# Patient Record
Sex: Female | Born: 1960
Health system: Southern US, Community
[De-identification: ages and names within clinical notes are randomized; demographics above are authoritative.]

## PROBLEM LIST (undated history)

## (undated) DIAGNOSIS — R011 Cardiac murmur, unspecified: Secondary | ICD-10-CM

## (undated) DIAGNOSIS — E079 Disorder of thyroid, unspecified: Secondary | ICD-10-CM

## (undated) HISTORY — DX: Disorder of thyroid, unspecified: E07.9

## (undated) HISTORY — DX: Cardiac murmur, unspecified: R01.1

---

## 1998-08-23 ENCOUNTER — Encounter: Payer: Self-pay | Admitting: Gastroenterology

## 1998-08-23 ENCOUNTER — Ambulatory Visit (HOSPITAL_COMMUNITY): Admission: RE | Admit: 1998-08-23 | Discharge: 1998-08-23 | Payer: Self-pay | Admitting: Gastroenterology

## 1999-08-07 ENCOUNTER — Encounter: Admission: RE | Admit: 1999-08-07 | Discharge: 1999-08-07 | Payer: Self-pay | Admitting: Gastroenterology

## 1999-08-07 ENCOUNTER — Encounter: Payer: Self-pay | Admitting: Gastroenterology

## 1999-11-03 ENCOUNTER — Other Ambulatory Visit: Admission: RE | Admit: 1999-11-03 | Discharge: 1999-11-03 | Payer: Self-pay | Admitting: *Deleted

## 2000-09-13 ENCOUNTER — Other Ambulatory Visit: Admission: RE | Admit: 2000-09-13 | Discharge: 2000-09-13 | Payer: Self-pay | Admitting: *Deleted

## 2001-09-18 ENCOUNTER — Encounter: Payer: Self-pay | Admitting: Gastroenterology

## 2001-09-18 ENCOUNTER — Encounter: Admission: RE | Admit: 2001-09-18 | Discharge: 2001-09-18 | Payer: Self-pay | Admitting: Gastroenterology

## 2001-09-19 ENCOUNTER — Other Ambulatory Visit: Admission: RE | Admit: 2001-09-19 | Discharge: 2001-09-19 | Payer: Self-pay | Admitting: Obstetrics and Gynecology

## 2003-04-22 ENCOUNTER — Other Ambulatory Visit: Admission: RE | Admit: 2003-04-22 | Discharge: 2003-04-22 | Payer: Self-pay | Admitting: Obstetrics and Gynecology

## 2003-05-07 ENCOUNTER — Ambulatory Visit (HOSPITAL_COMMUNITY): Admission: RE | Admit: 2003-05-07 | Discharge: 2003-05-07 | Payer: Self-pay | Admitting: Gastroenterology

## 2005-08-07 ENCOUNTER — Encounter: Admission: RE | Admit: 2005-08-07 | Discharge: 2005-08-07 | Payer: Self-pay | Admitting: Gastroenterology

## 2006-10-20 IMAGING — CT CT ABDOMEN W/ CM
1 of 7 series · 13 of 32 positions shown, 18 images · IV contrast (READICAT/WATER & [ID] OMNI 300)
Comparison: CT abdomen and pelvis, 05/07/03.

CLINICAL DATA: Epigastric pain.
 ABDOMEN CT WITH CONTRAST:
TECHNIQUE: Multidetector CT imaging of the abdomen was performed following the standard protocol during bolus administration of intravenous contrast.
 Contrast:   100 cc Omnipaque 300.  Oral contrast was also used.
TECHNIQUE: Multidetector CT imaging of the pelvis was performed following the standard protocol during bolus administration of intravenous contrast.

[Series 102: routine abdomen · axial · 0.70mm/px · z∈[-388,-29]mm · 13 of 333 slices shown, 18 images]
[im 23/333  soft-tissue]
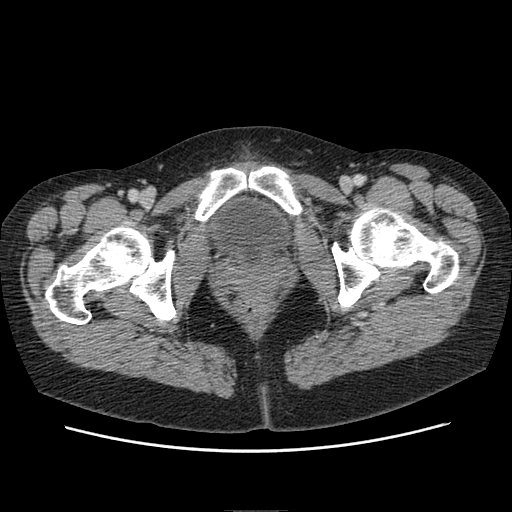
[im 23/333  bone]
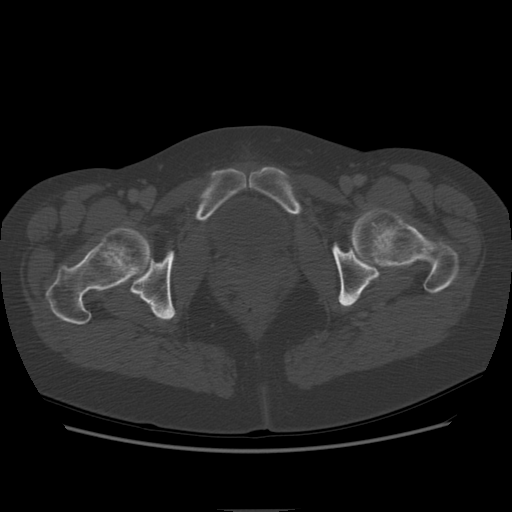
[im 45/333  soft-tissue]
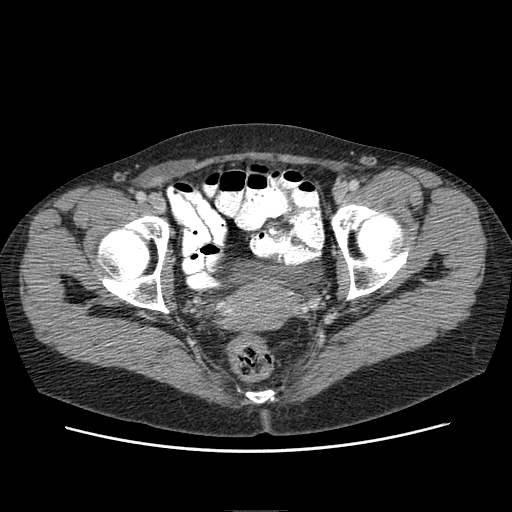
[im 67/333  soft-tissue]
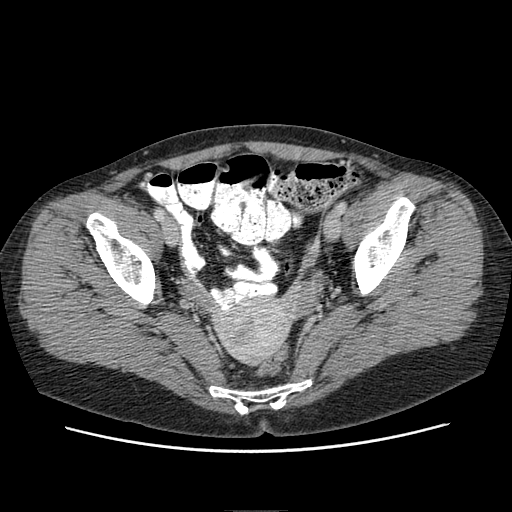
[im 111/333  soft-tissue]
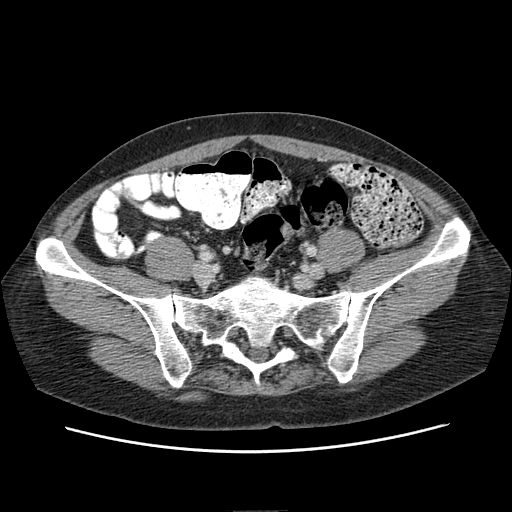
[im 133/333  soft-tissue]
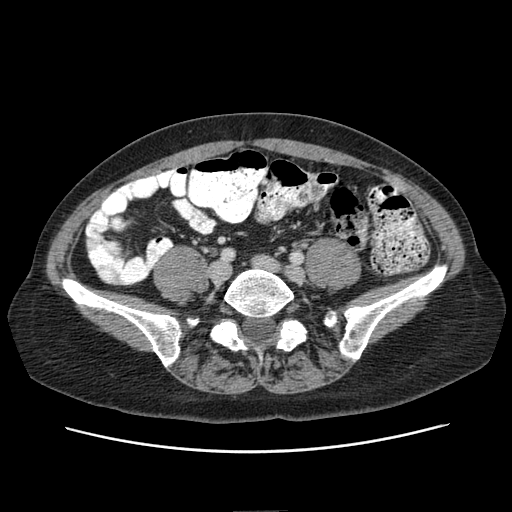
[im 155/333  soft-tissue]
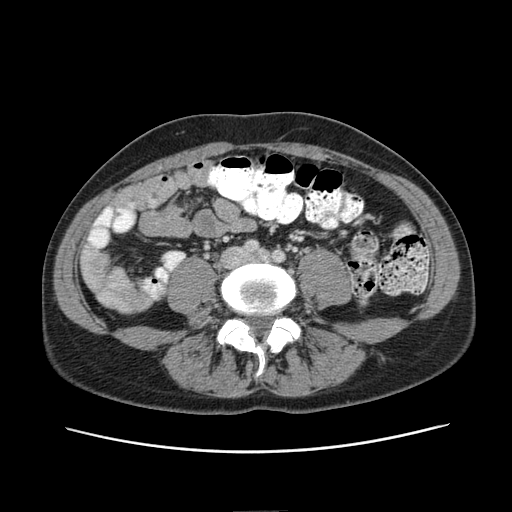
[im 178/333  soft-tissue]
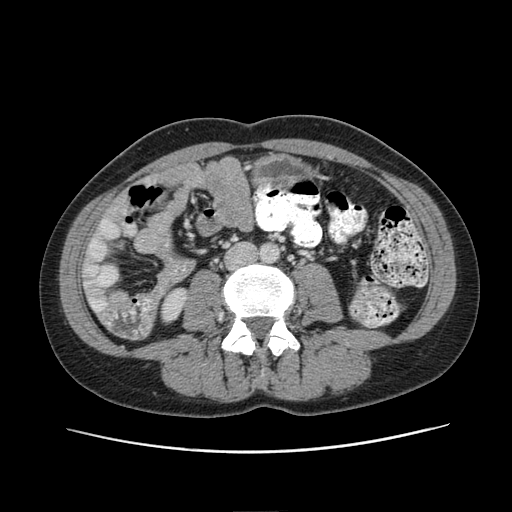
[im 200/333  soft-tissue]
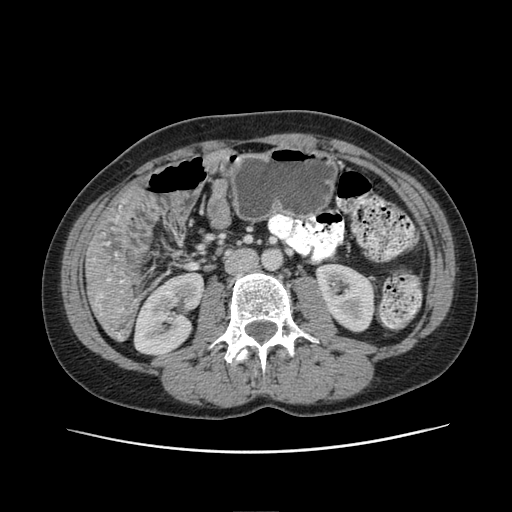
[im 222/333  soft-tissue]
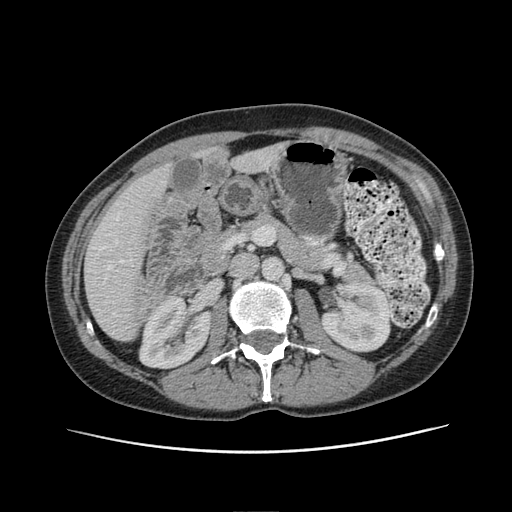
[im 222/333  bone]
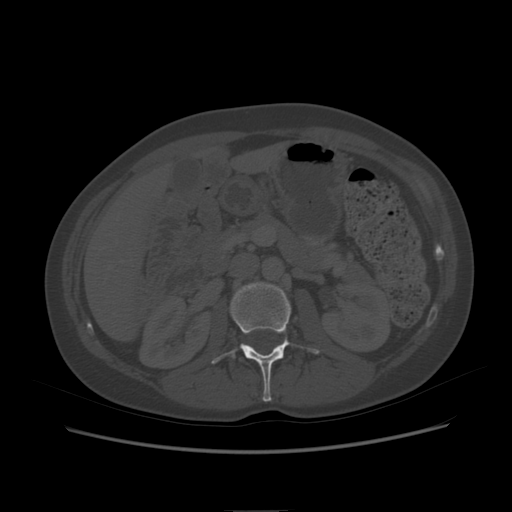
[im 244/333  lung]
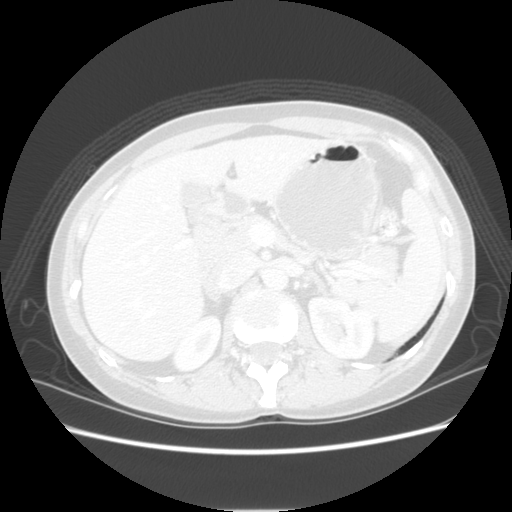
[im 266/333  soft-tissue]
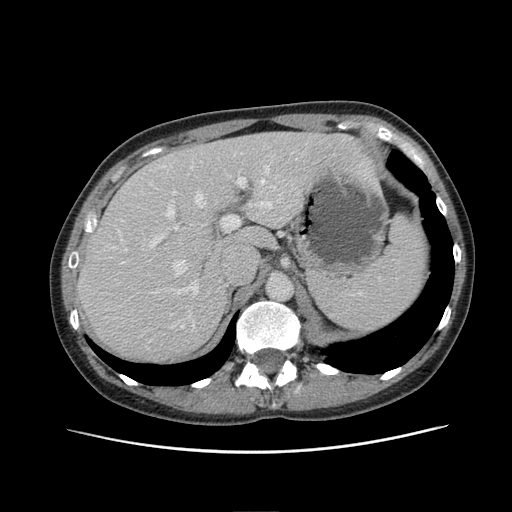
[im 266/333  lung]
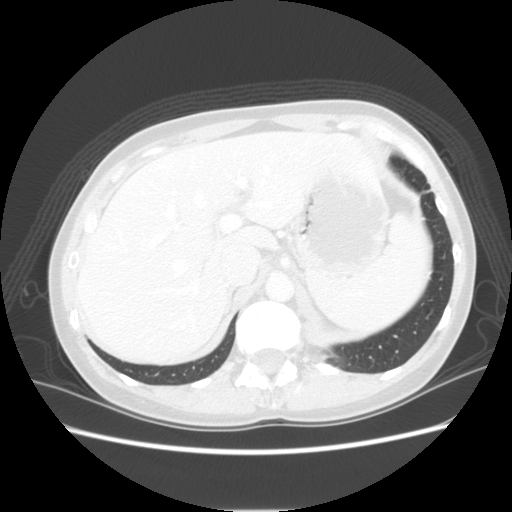
[im 288/333  soft-tissue]
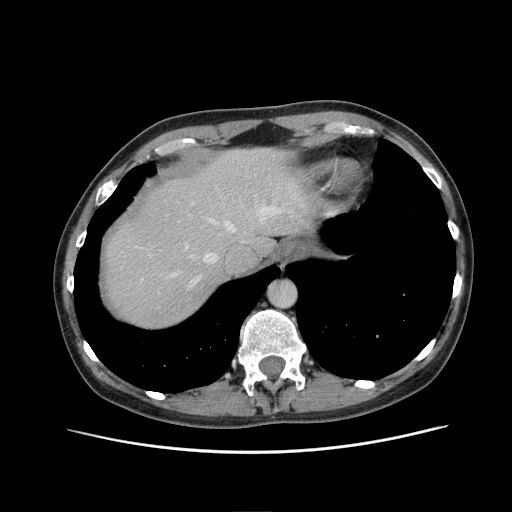
[im 288/333  lung]
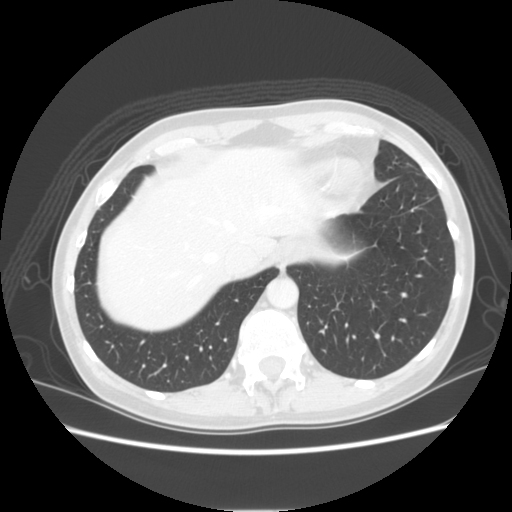
[im 310/333  soft-tissue]
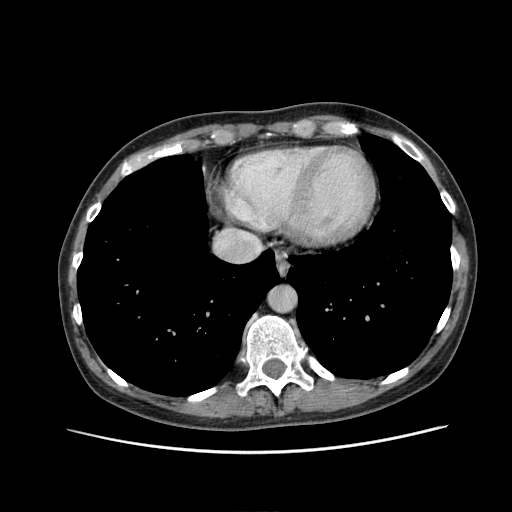
[im 310/333  lung]
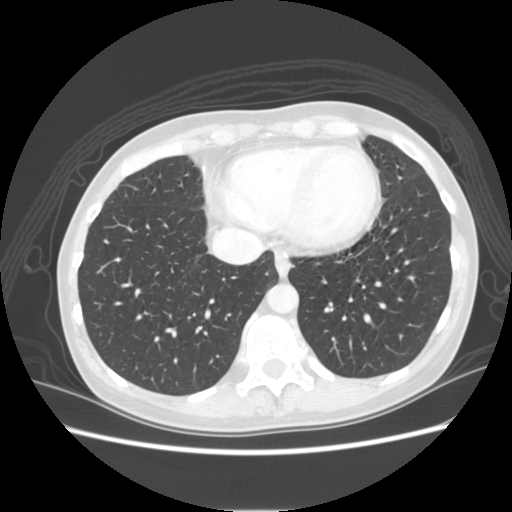

[13 of 32 positions shown; findings below may reference images not displayed]

FINDINGS: The lung bases are clear.  The liver enhances normally with no focal abnormality, and no ductal dilatation is seen.  No calcified gallstones are noted.  The pancreas is stable in size and configuration.  The adrenal glands and spleen are unchanged.  The kidneys enhance normally and on delayed images, the pelvocaliceal systems appear normal.  The abdominal aorta is normal in caliber.  Slightly prominent small bowel loops are present in the right upper quadrant of doubtful significance.  No small bowel wall edema is seen.  The majority of the small bowel does appear to be within the right abdomen, however, suggesting a malrotation without evidence of obstruction currently.
IMPRESSION: 1.  No acute intra-abdominal abnormality.
 2.   As noted on the prior CT, there is a degree of malrotation with the small bowel in the right abdomen without evidence of obstruction. 
 PELVIS CT WITH CONTRAST:
FINDINGS: The colon is slightly malrotated as well with the cecum in the anterior right mid pelvis.  The majority of the colon is to the left of midline.  The uterus is unchanged in size.  A left ovarian cyst is noted.  No free fluid is seen.  The urinary bladder is unremarkable.   A small area of low attenuation is noted overlying the right rectus musculature just above the right groin which was present previously and may be post inflammatory.
IMPRESSION: 1.  Malrotation.  No obstruction.  
 2.  No acute abnormality. 
 3.  Question post inflammatory change in upper right groin.

## 2010-05-06 ENCOUNTER — Encounter: Payer: Self-pay | Admitting: Gastroenterology

## 2013-01-05 ENCOUNTER — Other Ambulatory Visit: Payer: Self-pay | Admitting: Gastroenterology

## 2013-01-05 DIAGNOSIS — E039 Hypothyroidism, unspecified: Secondary | ICD-10-CM

## 2013-02-17 ENCOUNTER — Ambulatory Visit
Admission: RE | Admit: 2013-02-17 | Discharge: 2013-02-17 | Disposition: A | Discharge status: Home / Self Care | Payer: BC Managed Care – PPO | Source: Ambulatory Visit | Attending: Gastroenterology | Admitting: Gastroenterology

## 2013-02-17 DIAGNOSIS — E039 Hypothyroidism, unspecified: Secondary | ICD-10-CM

## 2014-05-11 ENCOUNTER — Other Ambulatory Visit: Payer: Self-pay | Admitting: Endocrinology

## 2014-05-11 DIAGNOSIS — E049 Nontoxic goiter, unspecified: Secondary | ICD-10-CM

## 2014-05-24 ENCOUNTER — Ambulatory Visit
Admission: RE | Admit: 2014-05-24 | Discharge: 2014-05-24 | Disposition: A | Discharge status: Home / Self Care | Payer: BLUE CROSS/BLUE SHIELD | Source: Ambulatory Visit | Attending: Endocrinology | Admitting: Endocrinology

## 2014-05-24 DIAGNOSIS — E049 Nontoxic goiter, unspecified: Secondary | ICD-10-CM

## 2015-08-06 IMAGING — US US SOFT TISSUE HEAD/NECK
1 series · 14 of 17 positions shown · non-contrast
Comparison: None.

CLINICAL DATA: Goiter

EXAM:
THYROID ULTRASOUND
TECHNIQUE: Ultrasound examination of the thyroid gland and adjacent soft
tissues was performed.

[Series 1: us soft tissue head/neck · 0.05mm/px · 14 of 17 slices shown]
[im 1/17]
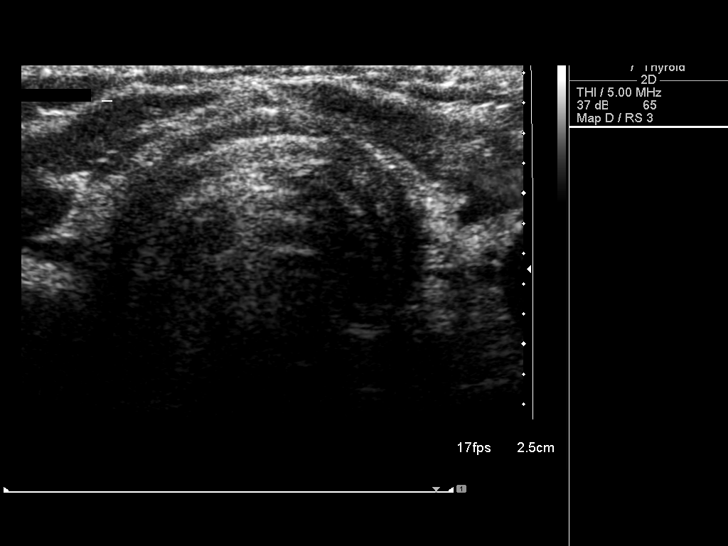
[im 2/17]
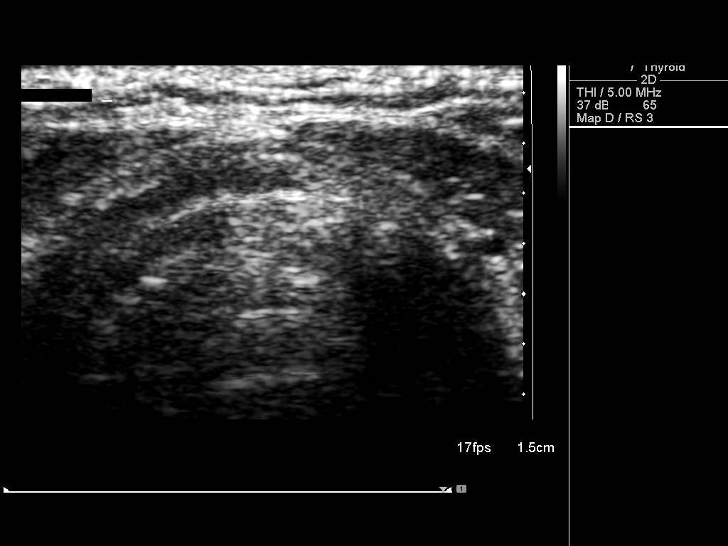
[im 4/17]
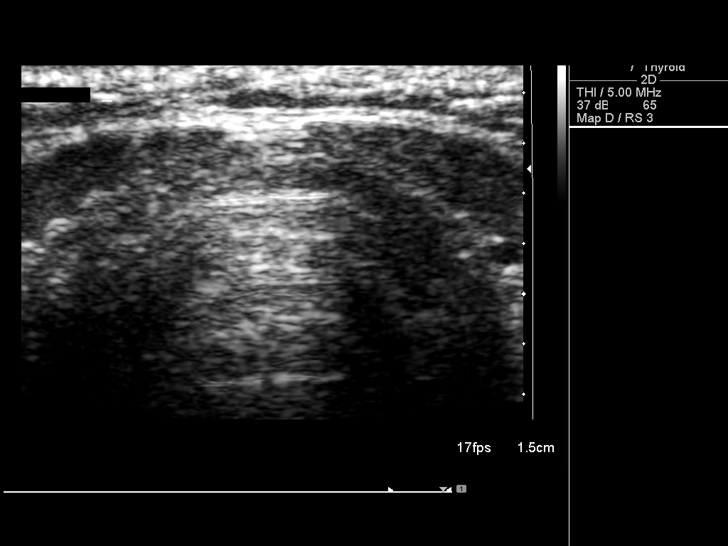
[im 5/17]
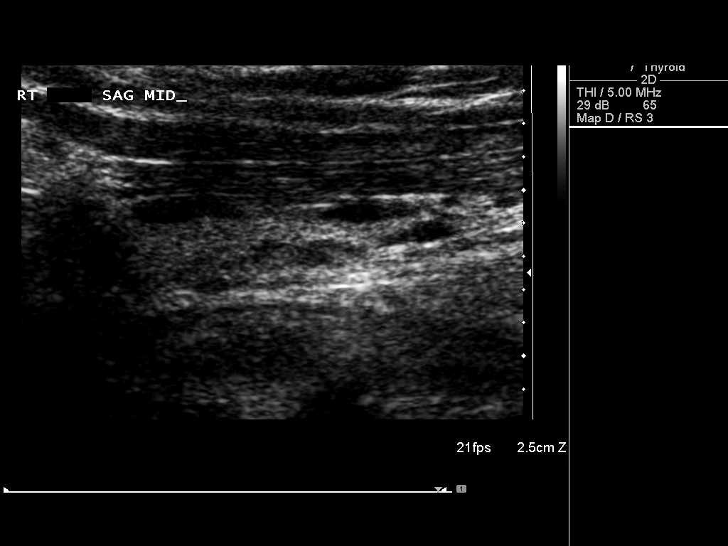
[im 6/17]
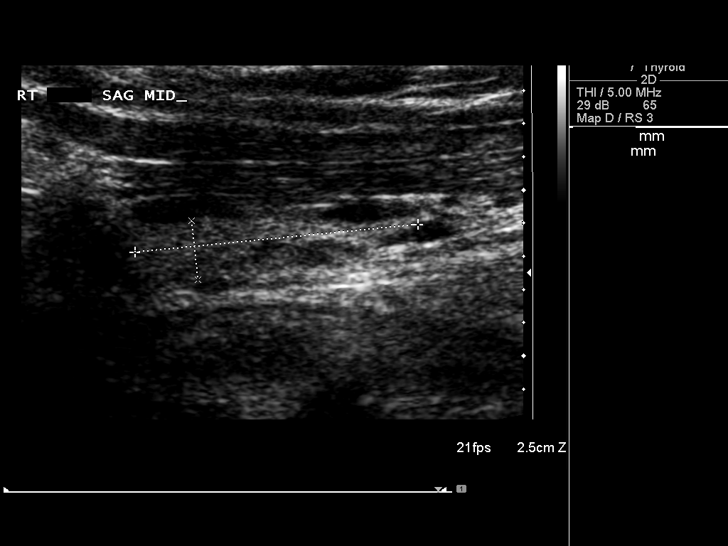
[im 7/17]
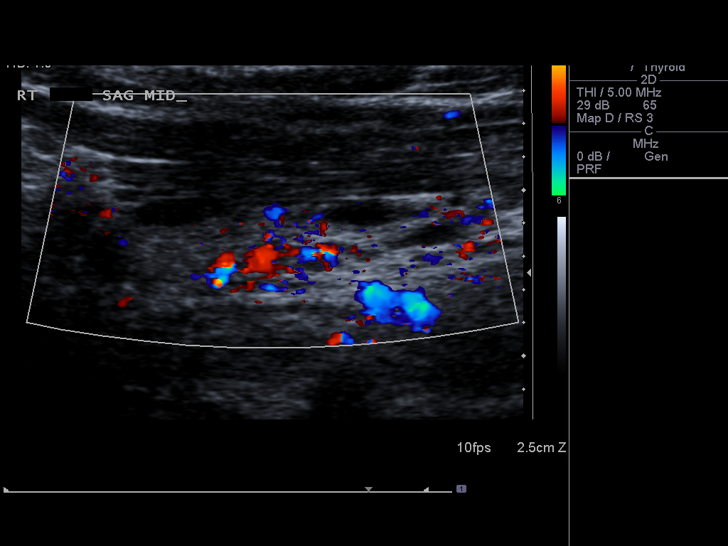
[im 8/17]
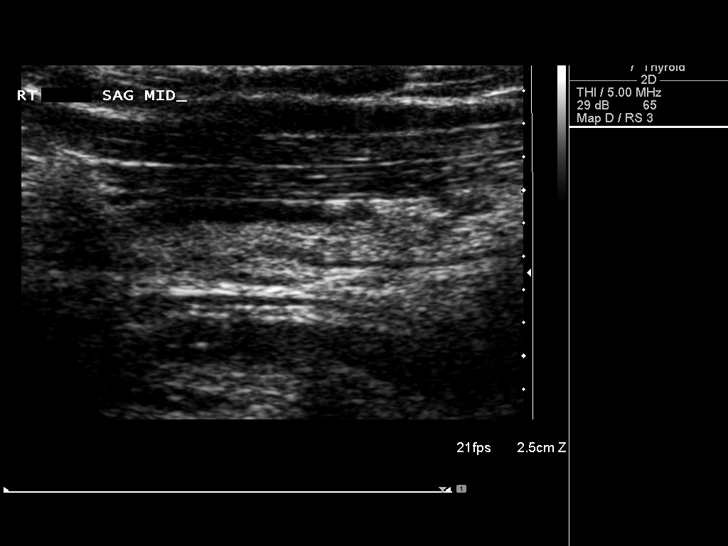
[im 10/17]
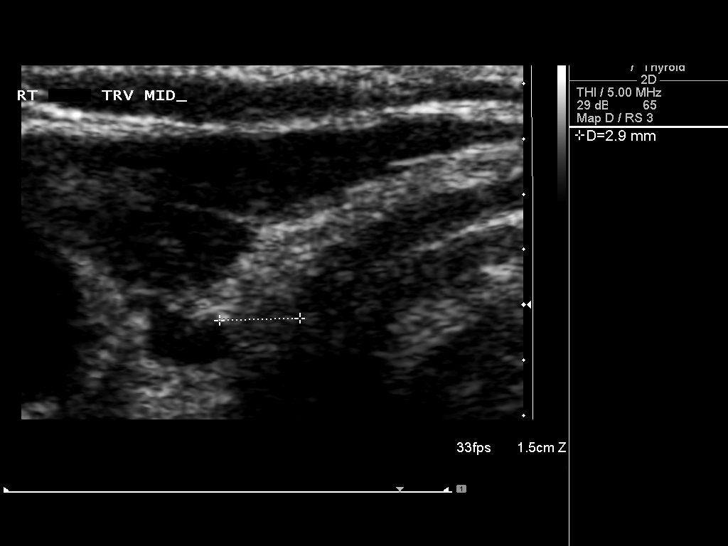
[im 11/17]
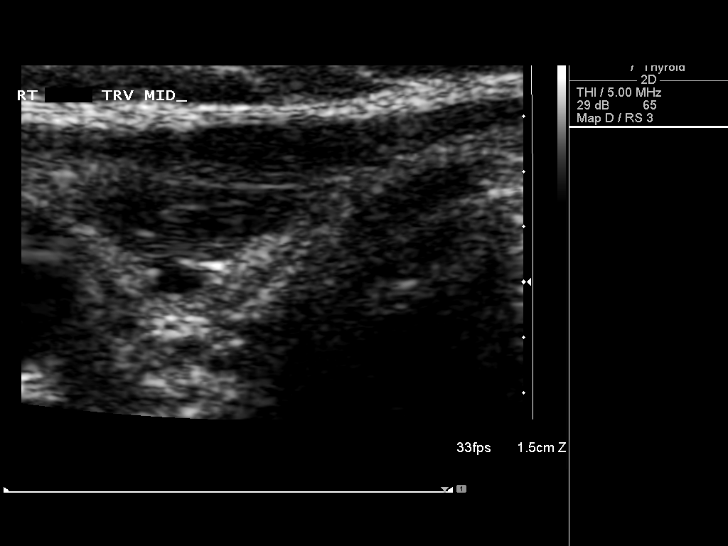
[im 12/17]
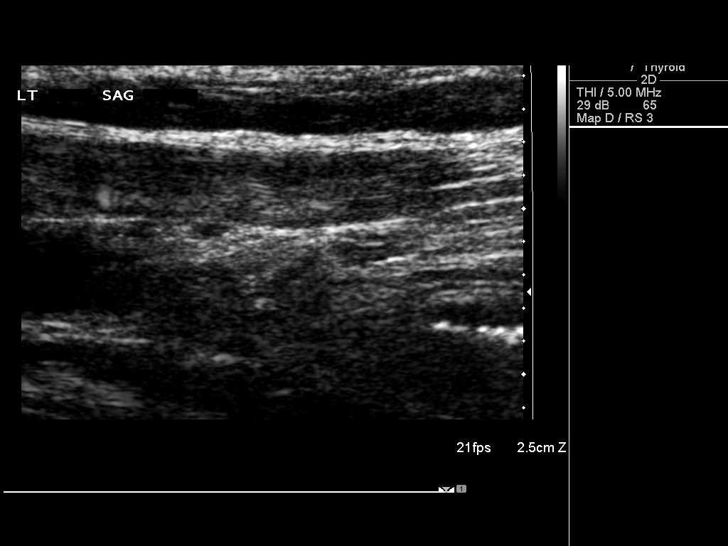
[im 13/17]
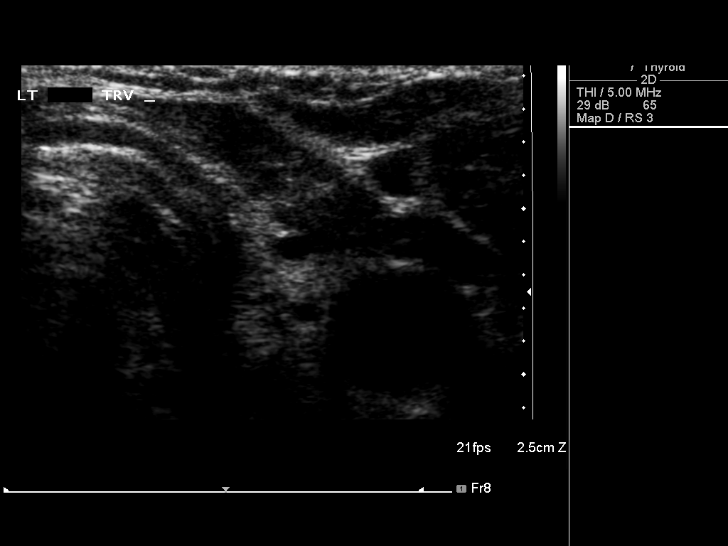
[im 14/17]
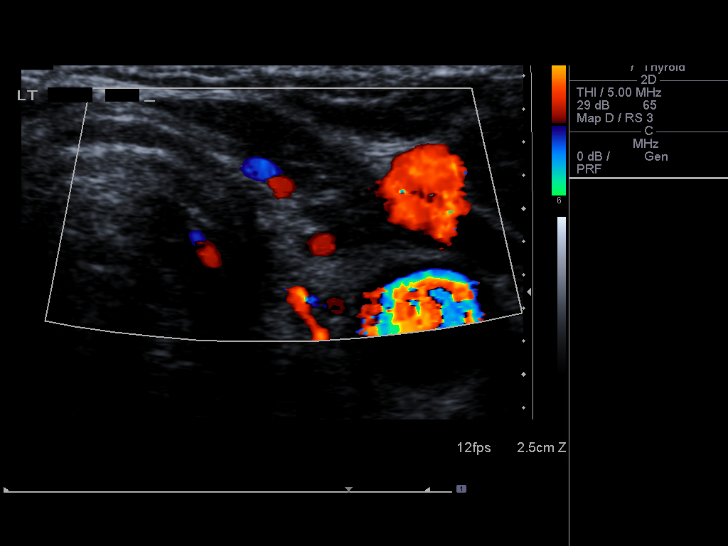
[im 16/17]
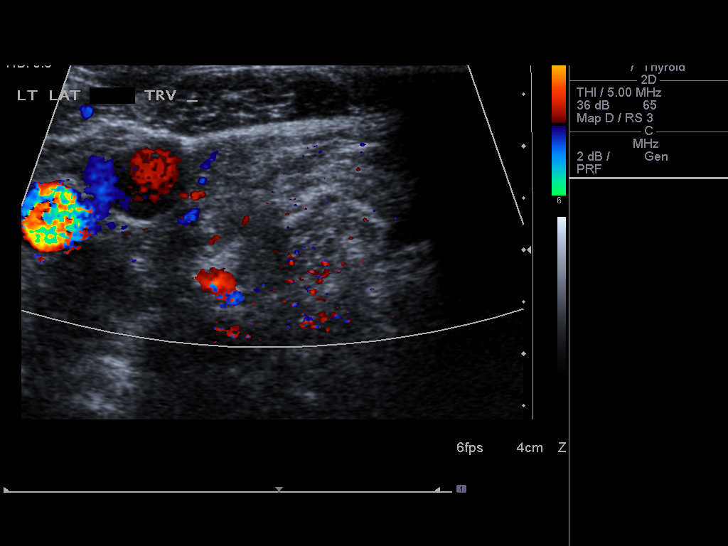
[im 17/17]
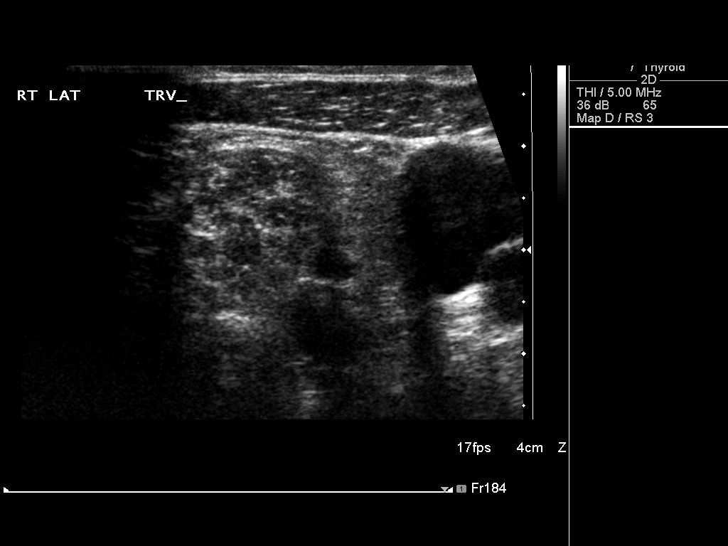

[14 of 17 positions shown; findings below may reference images not displayed]

FINDINGS: Right thyroid lobe

Measurements: 1.7 x 0.4 x 0.3 cm. Heterogeneous tissue without focal
nodule.

Left thyroid lobe

There is no residual tissue within the left thyroid bed.

Isthmus

Thickness: 1 mm.  No nodules visualized.

Lymphadenopathy

None visualized.
IMPRESSION: There is a small amount of residual thyroid tissue in the right
thyroid bed without focal nodule. There is no evidence of residual
left-sided thyroid tissue.

## 2017-03-18 ENCOUNTER — Other Ambulatory Visit: Payer: Self-pay | Admitting: Gastroenterology

## 2017-03-18 DIAGNOSIS — R1032 Left lower quadrant pain: Secondary | ICD-10-CM

## 2017-03-19 ENCOUNTER — Ambulatory Visit
Admission: RE | Admit: 2017-03-19 | Discharge: 2017-03-19 | Disposition: A | Discharge status: Home / Self Care | Payer: BLUE CROSS/BLUE SHIELD | Source: Ambulatory Visit | Attending: Gastroenterology | Admitting: Gastroenterology

## 2017-03-19 DIAGNOSIS — R1032 Left lower quadrant pain: Secondary | ICD-10-CM

## 2017-03-28 ENCOUNTER — Ambulatory Visit: Payer: BLUE CROSS/BLUE SHIELD | Admitting: Obstetrics & Gynecology

## 2017-03-28 ENCOUNTER — Other Ambulatory Visit: Payer: Self-pay

## 2017-03-28 ENCOUNTER — Encounter: Payer: Self-pay | Admitting: Obstetrics & Gynecology

## 2017-03-28 ENCOUNTER — Other Ambulatory Visit (HOSPITAL_COMMUNITY)
Admission: RE | Admit: 2017-03-28 | Discharge: 2017-03-28 | Disposition: A | Discharge status: Home / Self Care | Payer: BLUE CROSS/BLUE SHIELD | Source: Ambulatory Visit | Attending: Obstetrics & Gynecology | Admitting: Obstetrics & Gynecology

## 2017-03-28 VITALS — BP 112/70 | HR 66 | Resp 14 | Ht 67.25 in | Wt 154.0 lb

## 2017-03-28 DIAGNOSIS — Z124 Encounter for screening for malignant neoplasm of cervix: Secondary | ICD-10-CM

## 2017-03-28 DIAGNOSIS — R490 Dysphonia: Secondary | ICD-10-CM | POA: Diagnosis not present

## 2017-03-28 DIAGNOSIS — D251 Intramural leiomyoma of uterus: Secondary | ICD-10-CM | POA: Diagnosis not present

## 2017-03-28 DIAGNOSIS — N83202 Unspecified ovarian cyst, left side: Secondary | ICD-10-CM

## 2017-03-28 NOTE — Progress Notes (Signed)
GYNECOLOGY  VISIT  CC:   Referral from Dr. Collene Mares due to fibroid and complex ovarian cyst.  HPI: 56 y.o. [id number] Married Caucasian female here for new patient visit due to 2cm complex ovarian cyst and fibroid noted on ultrasound.  This was done due to low back pain that the patient has been experiencing.  GI evaluation has been negative.  Pt has had limited gyn care.  Last pap was approximately 10 years ago.  Denies vaginal bleeding.  Has never been on HRT.  Does need pap.  Does not ben mammograms.  Had thermography "some years ago".  Pt and I discussed limited value of this test.  Would recommend mammogram if pt going to have breast imaging.  Had ultrasound in 1998/1999 showing cyst on ovary.  Prior CT in 2006 did not show fibroid.  Pt has separate issued she's like to discuss.  Has experienced hoarse voice and voice changes over last several years.  Does not like the hoarseness ot her voice and would like a specialists opinion.   Will refer to ENT.  GYNECOLOGIC HISTORY: Patient's last menstrual period was 07/16/2006. Contraception: post menopausal  Menopausal hormone therapy: none  There are no active problems to display for this patient.   Past Medical History:  Diagnosis Date  . Heart murmur   . Thyroid disease     History reviewed. No pertinent surgical history.  MEDS:   Current Outpatient Medications on File Prior to Visit  Medication Sig Dispense Refill  . levothyroxine (SYNTHROID, LEVOTHROID) 100 MCG tablet Take 100 mcg by mouth daily.  6  . Multiple Vitamin (MULTIVITAMIN) tablet Take 1 tablet by mouth daily.     No current facility-administered medications on file prior to visit.     ALLERGIES: Tetracyclines & related  Family History  Problem Relation Age of Onset  . Lung cancer Mother     SH:  Married, non smoker  Review of Systems  All other systems reviewed and are negative.   PHYSICAL EXAMINATION:    BP 112/70 (BP Location: Right Arm, Patient  Position: Sitting, Cuff Size: Normal)   Pulse 66   Resp 14   Ht 5' 7.25" (1.708 m)   Wt 154 lb (69.9 kg)   LMP 07/16/2006   BMI 23.94 kg/m     General appearance: alert, cooperative and appears stated age Neck: no adenopathy, supple, symmetrical, trachea midline and thyroid normal to inspection and palpation CV:  Regular rate and rhythm Lungs:  clear to auscultation, no wheezes, rales or rhonchi, symmetric air entry Breasts: normal appearance, no masses or tenderness Abdomen: soft, non-tender; bowel sounds normal; no masses,  no organomegaly  Pelvic: External genitalia:  no lesions              Urethra:  normal appearing urethra with no masses, tenderness or lesions              Bartholins and Skenes: normal                 Vagina: normal appearing vagina with normal color and discharge, no lesions              Cervix: no lesions              Bimanual Exam:  Uterus:  normal size, contour, position, consistency, mobility, non-tender              Adnexa: no mass, fullness, tenderness  Rectovaginal: Yes.  .  Confirms.              Anus:  normal sphincter tone, no lesions  Chaperone was present for exam.  Assessment: Fibroid and complex cyst on ovary, noted in evaluation done due to low back pain Limited gyn care in last 10 years Hoarse voice  Plan: Ca-125 obtained today.  If negative, will repeat PUS in 3-4 months.  Pt does not want anything done unless there is a serious risk of cancer. Pap smear and HR HPV obtained today   ~40 minutes spent with patient >50% of time was in face to face discussion of above.

## 2017-03-29 ENCOUNTER — Telehealth: Payer: Self-pay

## 2017-03-29 DIAGNOSIS — N83202 Unspecified ovarian cyst, left side: Secondary | ICD-10-CM

## 2017-03-29 LAB — CA 125: CANCER ANTIGEN (CA) 125: 14.6 U/mL (ref 0.0–38.1)

## 2017-03-29 NOTE — Telephone Encounter (Signed)
Spoke with patient. Advised of message as seen below from [place]. Patient verbalizes understanding. PUS scheduled for 06/20/2017 at 3:30 pm with 4 pm consult with Dr.Miller. Patient is agreeable to date and time. Order placed. Encounter closed.

## 2017-03-29 NOTE — Telephone Encounter (Signed)
-----   Message from Megan Salon, MD sent at 03/29/2017  4:46 AM EST ----- Please let pt know her Ca-125 was normal.  Thanks.  She needs a follow up ultrasound scheduled in 3-4 months.  Please go ahead and schedule it.  Thanks.

## 2017-03-31 ENCOUNTER — Encounter: Payer: Self-pay | Admitting: Obstetrics & Gynecology

## 2017-04-02 LAB — CYTOLOGY - PAP
DIAGNOSIS: NEGATIVE
Diagnosis: REACTIVE
HPV (WINDOPATH): NOT DETECTED

## 2017-04-03 ENCOUNTER — Other Ambulatory Visit: Payer: Self-pay | Admitting: Dermatology

## 2017-04-10 ENCOUNTER — Telehealth: Payer: Self-pay | Admitting: Obstetrics & Gynecology

## 2017-04-10 NOTE — Telephone Encounter (Signed)
In speaking with patient regarding a referral, patient requested to obtain lab results. Advised patient I will forward to Triage for review. Patient is agreeable.  Routing to Triage

## 2017-04-10 NOTE — Telephone Encounter (Signed)
Spoke with patient, requesting pap results dated 03/28/17. Advised pap normal, hpv not detected , 02 recall per Dr. Sabra Heck.   Patient request to change PUS from 06/20/17 to 06/27/17. Rescheduled to 06/27/17 at 3:30pm with consult to follow at 4pm with Dr. Sabra Heck. Patient verbalizes understanding and is agreeable.   Routing to provider for final review. Patient is agreeable to disposition. Will close encounter.

## 2017-04-10 NOTE — Telephone Encounter (Signed)
Patient says she is returning a call to [place].

## 2017-04-10 NOTE — Telephone Encounter (Signed)
Returned call to patient.  A referral had been placed for the patient to be evaluated by Dr Redmond Baseman with [facility]. In speaking with Dr Redmond Baseman office today to check the status of the referral, I was advised by [facility], they left a voicmail message requesting the patient to call for scheduling. Spoke with patient to confirm she had their office number.  Patient acknowledged she received a call and that is on her "list of things to do". Provided patient with their phone number,  patient states she will call for scheduling.   cc: Dr Sabra Heck

## 2017-04-11 NOTE — Telephone Encounter (Signed)
OK to close encounter and referral.  Thanks.

## 2017-04-12 NOTE — Telephone Encounter (Signed)
See previous phone note dated 04/11/17 from Dr Sabra Heck. Referral has been closed. Ok to close encounter

## 2017-06-19 ENCOUNTER — Telehealth: Payer: Self-pay | Admitting: Obstetrics & Gynecology

## 2017-06-19 NOTE — Telephone Encounter (Signed)
Call placed to patient in regards to ultrasound appointment scheduled in 06/27/17. Insurance on file terminated 04/15/17, will need new insurance information for pre-certification process. Left voicemail message requesting a return call.

## 2017-06-20 ENCOUNTER — Other Ambulatory Visit: Payer: BLUE CROSS/BLUE SHIELD

## 2017-06-20 ENCOUNTER — Other Ambulatory Visit: Payer: BLUE CROSS/BLUE SHIELD | Admitting: Obstetrics & Gynecology

## 2017-06-24 NOTE — Telephone Encounter (Signed)
Second call placed to patient in regards to scheduled ultrasound appointment. Need to obtain new insurance information for pre-certification, as current [employer] policy on file has terminated. Left voicemail message on both the mobile and home numbers listed.

## 2017-06-24 NOTE — Telephone Encounter (Signed)
Return call to Suzy. °

## 2017-06-25 NOTE — Telephone Encounter (Signed)
Call placed to patient. Patient is scheduled for an ultrasound on 06/27/17. We need new insurance information for pre-cert. Left voicemail message requesting a return call.

## 2017-06-26 NOTE — Telephone Encounter (Signed)
Spoke with patient on 06/25/17, she provided new insurance information. Benefits have been pre-certified for schedule appointment. Spoke with patient to review benefits and appointment information. Patient request to reschedule appointment to 08/01/17. Patient has been rescheduled on 08/01/17 with Dr Sabra Heck. Patient is aware of appointment date, arrival time and cancellation policy. Patient is also aware of cancellation policy for the 4/46/19 appointment. No further questions.  Routing to Dr Sabra Heck for final review  cc: Lamont Snowball, RN

## 2017-06-27 ENCOUNTER — Other Ambulatory Visit: Payer: Self-pay

## 2017-06-27 ENCOUNTER — Other Ambulatory Visit: Payer: Self-pay | Admitting: Obstetrics & Gynecology

## 2017-07-16 ENCOUNTER — Telehealth: Payer: Self-pay | Admitting: Obstetrics & Gynecology

## 2017-07-16 NOTE — Telephone Encounter (Signed)
Call placed to patient in reference multiple ultrasound cancellations, the most recent being appointment date 08/01/17. Patient advises she is unable to keep appointment due to working the [employer]". Patient has declined to reschedule, stating, she will be leaving to go out of the country "with in the week and is not sure when I will return" but states she will call when she returns, but advises it could be as late as June.   Routing to Dr Sabra Heck  cc: Lamont Snowball, RN

## 2017-07-16 NOTE — Telephone Encounter (Signed)
Patient canceled her PUS/OV 08/01/17. Patient would like to reschedule. To triage for rescheduling.

## 2017-07-16 NOTE — Telephone Encounter (Signed)
Routing to [place] D. For return call.

## 2017-07-26 ENCOUNTER — Encounter: Payer: Self-pay | Admitting: *Deleted

## 2017-07-26 NOTE — Telephone Encounter (Signed)
Letter to your office. Please review/revise.

## 2017-07-29 NOTE — Telephone Encounter (Signed)
Letter reviewed and signed by Dr Sabra Heck. Mailed certified and regular [place] mail.   Encounter closed.

## 2017-08-01 ENCOUNTER — Other Ambulatory Visit: Payer: Self-pay | Admitting: Obstetrics & Gynecology

## 2017-08-01 ENCOUNTER — Other Ambulatory Visit: Payer: Self-pay

## 2017-11-04 MED FILL — ATOVAQUONE-PROGUANIL 250-10: 250-100 | 37 days supply | Qty: 37 | Fill #0

## 2017-11-04 MED FILL — CIPROFLOXACIN HCL 500 MG TA: 500 | 3 days supply | Qty: 6 | Fill #0

## 2021-06-21 ENCOUNTER — Ambulatory Visit (INDEPENDENT_AMBULATORY_CARE_PROVIDER_SITE_OTHER): Payer: No Typology Code available for payment source | Admitting: Podiatry

## 2021-06-21 ENCOUNTER — Other Ambulatory Visit: Payer: Self-pay

## 2021-06-21 ENCOUNTER — Encounter: Payer: Self-pay | Admitting: Podiatry

## 2021-06-21 DIAGNOSIS — L600 Ingrowing nail: Secondary | ICD-10-CM | POA: Diagnosis not present

## 2021-06-21 NOTE — Progress Notes (Signed)
Subjective:  ? ?Patient ID: Carla Anthony, female   DOB: 61 y.o.   MRN: [id number]  ? ?HPI ?Patient presents stating that her right big toenail was traumatized and number of years ago was removed and its never grown out the same since it does not grow significantly but does not hurt.  Patient does not smoke is active ? ? ?Review of Systems  ?All other systems reviewed and are negative. ? ? ?   ?Objective:  ?Physical Exam ?Vitals and nursing note reviewed.  ?Constitutional:   ?   Appearance: She is well-developed.  ?Pulmonary:  ?   Effort: Pulmonary effort is normal.  ?Musculoskeletal:     ?   General: Normal range of motion.  ?Skin: ?   General: Skin is warm.  ?Neurological:  ?   Mental Status: She is alert.  ?  ?Neurovascular status intact muscle strength adequate range of motion adequate with patient found to have a moderately damaged hallux nail right foot localized with no erythema edema or pain associated with it ? ?   ?Assessment:  ?Damaged right hallux nail but no pain and no significant history of infection ? ?   ?Plan:  ?H&P reviewed do not recommend treatment currently as its not hurting and I think if I remove it I am worried you will grow back worse and permanent removal would be overkill at this time.  I made her aware of all of this and unfortunately this is 1 given the fact is been this way for a number years that there is nothing else we can do for it ?   ? ? ?

## 2021-11-02 ENCOUNTER — Emergency Department
Admission: EM | Admit: 2021-11-02 | Discharge: 2021-11-02 | Disposition: A | Discharge status: Home / Self Care | Payer: No Typology Code available for payment source | Attending: Family Medicine | Admitting: Family Medicine

## 2021-11-02 ENCOUNTER — Other Ambulatory Visit (HOSPITAL_BASED_OUTPATIENT_CLINIC_OR_DEPARTMENT_OTHER): Payer: Self-pay

## 2021-11-02 DIAGNOSIS — W57XXXA Bitten or stung by nonvenomous insect and other nonvenomous arthropods, initial encounter: Secondary | ICD-10-CM | POA: Diagnosis not present

## 2021-11-02 DIAGNOSIS — S40862A Insect bite (nonvenomous) of left upper arm, initial encounter: Secondary | ICD-10-CM | POA: Diagnosis not present

## 2021-11-02 DIAGNOSIS — S40861A Insect bite (nonvenomous) of right upper arm, initial encounter: Secondary | ICD-10-CM

## 2021-11-02 MED ORDER — DOXYCYCLINE HYCLATE 100 MG PO CAPS
100.0000 mg | ORAL_CAPSULE | Freq: Two times a day (BID) | ORAL | 0 refills | Status: AC
  Filled 2021-11-02: qty 20, 10d supply, fill #0

## 2021-11-02 NOTE — Discharge Instructions (Addendum)
Instructed patient to take medication as directed with food to completion.  Encouraged patient to increase daily water intake while taking this medication.  Advised/encouraged patient if any adverse reactions occur with Doxycycline please stop medication and contact me immediately.  Advised patient if symptoms worsen and/or unresolved please follow-up with PCP or here for further evaluation.

## 2021-11-02 NOTE — ED Triage Notes (Signed)
Pt states that she has a tick bite to right forearm. X1 day

## 2021-11-02 NOTE — ED Provider Notes (Signed)
[facility]    CSN: [id number] Arrival date & time: 11/02/21  1520      History   Chief Complaint Chief Complaint  Patient presents with   Tick Removal   Tick bite    Tick bite right arm. X1 day    HPI Carla Anthony is a 61 y.o. female.   HPI Pleasant 61 year old female presents with tick bite to right arm that occurred yesterday, Wednesday, 11/01/2021.  PMH significant for hypothyroidism.  Past Medical History:  Diagnosis Date   Heart murmur    Thyroid disease     There are no problems to display for this patient.   History reviewed. No pertinent surgical history.  OB History     Gravida  1   Para  0   Term      Preterm      AB  1   Living         SAB  1   IAB      Ectopic      Multiple      Live Births               Home Medications    Prior to Admission medications   Medication Sig Start Date End Date Taking? Authorizing Provider  doxycycline (VIBRAMYCIN) 100 MG capsule Take 1 capsule (100 mg total) by mouth 2 (two) times daily for 10 days. 11/02/21 11/12/21 Yes Eliezer Lofts, FNP  levothyroxine (SYNTHROID, LEVOTHROID) 100 MCG tablet Take 100 mcg by mouth daily. 02/20/17  Yes [provider]  Multiple Vitamin (MULTIVITAMIN) tablet Take 1 tablet by mouth daily.   Yes [provider]    Family History Family History  Problem Relation Age of Onset   Lung cancer Mother     Social History Social History   Tobacco Use   Smoking status: Never   Smokeless tobacco: Never  Vaping Use   Vaping Use: Never used  Substance Use Topics   Alcohol use: Yes    Alcohol/week: 1.0 standard drink of alcohol    Types: 1 Cans of beer per week   Drug use: No     Allergies   Tetracyclines & related   Review of Systems Review of Systems  Skin:  Positive for rash.     Physical Exam Triage Vital Signs ED Triage Vitals  Enc Vitals Group     BP      Pulse      Resp      Temp      Temp src      SpO2       Weight      Height      Head Circumference      Peak Flow      Pain Score      Pain Loc      Pain Edu?      Excl. in [place]?    No data found.  Updated Vital Signs BP 131/79 (BP Location: Right Arm)   Pulse 60   Temp 97.7 F (36.5 C) (Oral)   Resp 18   Ht '5\' 8"'$  (1.727 m)   Wt 145 lb (65.8 kg)   LMP 07/16/2006   SpO2 99%   BMI 22.05 kg/m    Physical Exam Vitals and nursing note reviewed.  Constitutional:      General: She is not in acute distress.    Appearance: Normal appearance. She is normal weight.  HENT:     Head: Normocephalic  and atraumatic.     Mouth/Throat:     Mouth: Mucous membranes are moist.     Pharynx: Oropharynx is clear.  Eyes:     Extraocular Movements: Extraocular movements intact.     Conjunctiva/sclera: Conjunctivae normal.     Pupils: Pupils are equal, round, and reactive to light.  Cardiovascular:     Rate and Rhythm: Normal rate and regular rhythm.     Pulses: Normal pulses.     Heart sounds: Normal heart sounds.  Pulmonary:     Effort: Pulmonary effort is normal.     Breath sounds: Normal breath sounds. No wheezing, rhonchi or rales.  Musculoskeletal:     Cervical back: Normal range of motion and neck supple.  Skin:    General: Skin is warm and dry.     Comments: Right lower arm (mid volar aspect): Tiny(<2-3 mm) non-erythematous insect bite mark, no targetoid lesion noted, non-fluctuant, non-indurated, no lymphatic streaking  Neurological:     General: No focal deficit present.     Mental Status: She is alert and oriented to person, place, and time.      UC Treatments / Results  Labs (all labs ordered are listed, but only abnormal results are displayed) Labs Reviewed - No data to display  EKG   Radiology No results found.  Procedures Procedures (including critical care time)  Medications Ordered in UC Medications - No data to display  Initial Impression / Assessment and Plan / UC Course  I have reviewed the triage vital  signs and the nursing notes.  Pertinent labs & imaging results that were available during my care of the patient were reviewed by me and considered in my medical decision making (see chart for details).     MDM: 1.  Tick bite of right lower arm, initial encounter-Rx'd Doxycycline.  Patient has tetracycline/related allergies; however, patient reports initially tried Doxycycline when she was a teenager, 50 years ago and only adverse reaction was upset stomach. Instructed patient to take medication as directed with food to completion.  Encouraged patient to increase daily water intake while taking this medication.  Advised/encouraged patient if any adverse reactions occur with Doxycycline please stop medication and contact me immediately.  Advised patient if symptoms worsen and/or unresolved please follow-up with PCP or here for further evaluation.  Final Clinical Impressions(s) / UC Diagnoses   Final diagnoses:  Tick bite of right upper arm, initial encounter     Discharge Instructions      Instructed patient to take medication as directed with food to completion.  Encouraged patient to increase daily water intake while taking this medication.  Advised/encouraged patient if any adverse reactions occur with Doxycycline please stop medication and contact me immediately.  Advised patient if symptoms worsen and/or unresolved please follow-up with PCP or here for further evaluation.     ED Prescriptions     Medication Sig Dispense Auth. Provider   doxycycline (VIBRAMYCIN) 100 MG capsule Take 1 capsule (100 mg total) by mouth 2 (two) times daily for 10 days. 20 capsule Eliezer Lofts, FNP      PDMP not reviewed this encounter.   Eliezer Lofts, [place] 11/02/21 [phone]

## 2021-11-13 ENCOUNTER — Other Ambulatory Visit: Payer: Self-pay | Admitting: *Deleted

## 2021-11-13 DIAGNOSIS — M7989 Other specified soft tissue disorders: Secondary | ICD-10-CM

## 2021-12-04 ENCOUNTER — Encounter (HOSPITAL_COMMUNITY): Payer: BLUE CROSS/BLUE SHIELD

## 2021-12-06 ENCOUNTER — Encounter: Payer: BLUE CROSS/BLUE SHIELD | Admitting: Vascular Surgery

## 2022-01-17 ENCOUNTER — Ambulatory Visit (INDEPENDENT_AMBULATORY_CARE_PROVIDER_SITE_OTHER): Payer: No Typology Code available for payment source | Admitting: Vascular Surgery

## 2022-01-17 ENCOUNTER — Encounter: Payer: Self-pay | Admitting: Vascular Surgery

## 2022-01-17 ENCOUNTER — Ambulatory Visit (HOSPITAL_COMMUNITY)
Admission: RE | Admit: 2022-01-17 | Discharge: 2022-01-17 | Disposition: A | Discharge status: Home / Self Care | Payer: No Typology Code available for payment source | Source: Ambulatory Visit | Attending: Vascular Surgery | Admitting: Vascular Surgery

## 2022-01-17 VITALS — BP 108/72 | HR 66 | Temp 98.4°F | Resp 18 | Ht 67.0 in | Wt 147.6 lb

## 2022-01-17 DIAGNOSIS — M7989 Other specified soft tissue disorders: Secondary | ICD-10-CM | POA: Diagnosis present

## 2022-01-17 DIAGNOSIS — I83813 Varicose veins of bilateral lower extremities with pain: Secondary | ICD-10-CM

## 2022-01-17 NOTE — Progress Notes (Signed)
ASSESSMENT & PLAN   CHRONIC VENOUS DISEASE: This patient has CEAP C1 venous disease (telangiectasias and reticular veins).  We have discussed the importance of daily leg elevation and the proper positioning for this.  I think she could benefit from a knee-high compression stocking with a gradient of 15 to 20 mmHg when she will be standing or sitting for prolonged period of time and for travel.  I have encouraged her to stay as active as possible.  I have encouraged her to avoid prolonged sitting and standing.  Currently she is not a candidate for laser ablation on either side.  She might potentially be a candidate for sclerotherapy however given that these are reticular veins which are larger this would take multiple sessions and would probably be fairly expensive.  Certainly if her varicosities enlarge and she developed significant symptoms from hypertension we can reevaluate her.  She knows to call if things change.  REASON FOR CONSULT:    Varicose veins.  The patient is self-referred.  HPI:   Carla Anthony is a 61 y.o. female who presents for evaluation of varicose veins.  She has multiple varicose veins on both legs that are bothersome.  However she denies any significant symptoms related to these varicose veins.  She has no aching pain or heaviness in her legs.  She has no significant problems with leg swelling that she is been traveling.  She denies any previous history of DVT or previous venous procedures.  She is very active.  She denies any history of claudication or rest pain.  Varicose veins are equal on both legs.   Past Medical History:  Diagnosis Date   Heart murmur    Thyroid disease     Family History  Problem Relation Age of Onset   Varicose Veins Mother    Lung cancer Mother    Varicose Veins Other     SOCIAL HISTORY: Social History   Tobacco Use   Smoking status: Never   Smokeless tobacco: Never  Substance Use Topics   Alcohol use: Yes    Alcohol/week: 1.0  standard drink of alcohol    Types: 1 Cans of beer per week    Allergies  Allergen Reactions   Tetracyclines & Related Nausea And Vomiting    Current Outpatient Medications  Medication Sig Dispense Refill   levothyroxine (SYNTHROID, LEVOTHROID) 100 MCG tablet Take 112 mcg by mouth daily.  6   Multiple Vitamin (MULTIVITAMIN) tablet Take 1 tablet by mouth daily.     No current facility-administered medications for this visit.    REVIEW OF SYSTEMS:  '[X]'$  denotes positive finding, '[ ]'$  denotes negative finding Cardiac  Comments:  Chest pain or chest pressure:    Shortness of breath upon exertion:    Short of breath when lying flat:    Irregular heart rhythm:        Vascular    Pain in calf, thigh, or hip brought on by ambulation:    Pain in feet at night that wakes you up from your sleep:     Blood clot in your veins:    Leg swelling:         Pulmonary    Oxygen at home:    Productive cough:     Wheezing:         Neurologic    Sudden weakness in arms or legs:     Sudden numbness in arms or legs:     Sudden onset of difficulty speaking or  slurred speech:    Temporary loss of vision in one eye:     Problems with dizziness:         Gastrointestinal    Blood in stool:     Vomited blood:         Genitourinary    Burning when urinating:     Blood in urine:        Psychiatric    Major depression:         Hematologic    Bleeding problems:    Problems with blood clotting too easily:        Skin    Rashes or ulcers:        Constitutional    Fever or chills:    -  PHYSICAL EXAM:   Vitals:   01/17/22 1450  BP: 108/72  Pulse: 66  Resp: 18  Temp: 98.4 F (36.9 C)  TempSrc: Temporal  SpO2: 96%  Weight: 147 lb 9.6 oz (67 kg)  Height: '5\' 7"'$  (1.702 m)   Body mass index is 23.12 kg/m. GENERAL: The patient is a well-nourished female, in no acute distress. The vital signs are documented above. CARDIAC: There is a regular rate and rhythm.  VASCULAR: I do not  detect carotid bruits. She has palpable pedal pulses. She has some reticular veins and telangiectasias bilaterally.         I did look at the great saphenous veins myself with the [employer].  On both sides the vein is quite small.  PULMONARY: There is good air exchange bilaterally without wheezing or rales. ABDOMEN: Soft and non-tender with normal pitched bowel sounds.  MUSCULOSKELETAL: There are no major deformities. NEUROLOGIC: No focal weakness or paresthesias are detected. SKIN: There are no ulcers or rashes noted. PSYCHIATRIC: The patient has a normal affect.  DATA:    VENOUS DUPLEX: I have independently interpreted her venous duplex scan today.  This was of the right lower extremity only.  There was no evidence of DVT.  There was no evidence of deep venous reflux.  There was superficial venous reflux in the right great saphenous vein in the proximal thigh.  The vein had diameters ranging from 3.6-3.9 mm.     Deitra Mayo [facility]

## 2022-12-07 ENCOUNTER — Other Ambulatory Visit (HOSPITAL_BASED_OUTPATIENT_CLINIC_OR_DEPARTMENT_OTHER): Payer: Self-pay

## 2022-12-07 MED ORDER — MELOXICAM 7.5 MG PO TABS
7.5000 mg | ORAL_TABLET | Freq: Every day | ORAL | 0 refills | Status: AC
  Filled 2022-12-07: qty 14, 14d supply, fill #0

## 2023-11-07 ENCOUNTER — Other Ambulatory Visit (HOSPITAL_BASED_OUTPATIENT_CLINIC_OR_DEPARTMENT_OTHER): Payer: Self-pay

## 2023-11-07 MED ORDER — GOLYTELY 236 G PO SOLR
ORAL | 0 refills | Status: AC
  Filled 2023-11-07: qty 4000, 1d supply, fill #0

## 2023-11-09 ENCOUNTER — Other Ambulatory Visit (HOSPITAL_BASED_OUTPATIENT_CLINIC_OR_DEPARTMENT_OTHER): Payer: Self-pay
